# Patient Record
Sex: Male | Born: 1995 | Race: White | Hispanic: No | Marital: Single | Smoking: Never smoker
Health system: Southern US, Community
[De-identification: ages and names within clinical notes are randomized; demographics above are authoritative.]

---
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
## 2020-09-19 ENCOUNTER — Encounter: Payer: Self-pay | Admitting: Family Medicine

## 2020-09-19 ENCOUNTER — Other Ambulatory Visit: Payer: Self-pay

## 2020-09-19 ENCOUNTER — Ambulatory Visit (INDEPENDENT_AMBULATORY_CARE_PROVIDER_SITE_OTHER): Payer: 59 | Admitting: Family Medicine

## 2020-09-19 VITALS — BP 100/60 | HR 80 | Ht 69.0 in | Wt 153.0 lb

## 2020-09-19 DIAGNOSIS — H1031 Unspecified acute conjunctivitis, right eye: Secondary | ICD-10-CM | POA: Diagnosis not present

## 2020-09-19 DIAGNOSIS — T1591XA Foreign body on external eye, part unspecified, right eye, initial encounter: Secondary | ICD-10-CM

## 2020-09-19 NOTE — Progress Notes (Signed)
Date:  09/19/2020   Name:  Cody Adams   DOB:  1995-09-14   MRN:  [id number]   Chief Complaint: Eye Pain (R) eye- red/ blood shot, sore when blinking. Was crusty this morning)  Eye Pain  The right eye is affected. This is a new problem. The current episode started yesterday. The problem has been gradually worsening. There was no injury mechanism. The pain is at a severity of 3/10. The pain is mild ("itchy,scratchy"). There is No known exposure to pink eye. He Does not wear contacts. Associated symptoms include an eye discharge, eye redness and itching. Pertinent negatives include no blurred vision, fever, foreign body sensation, photophobia or vomiting. He has tried eye drops for the symptoms. The treatment provided mild relief.   No results found for: CREATININE, BUN, NA, K, CL, CO2 No results found for: CHOL, HDL, LDLCALC, LDLDIRECT, TRIG, CHOLHDL No results found for: TSH No results found for: HGBA1C No results found for: WBC, HGB, HCT, MCV, PLT No results found for: ALT, AST, GGT, ALKPHOS, BILITOT   Review of Systems  Constitutional:  Negative for fever.  Eyes:  Positive for pain, discharge, redness and itching. Negative for blurred vision and photophobia.  Respiratory:  Negative for shortness of breath.   Cardiovascular:  Negative for chest pain and palpitations.  Gastrointestinal:  Negative for abdominal pain, blood in stool and vomiting.  Genitourinary:  Negative for difficulty urinating.   There are no problems to display for this patient.   Not on File    Social History   Tobacco Use   Smokeless tobacco: Never  Vaping Use   Vaping Use: Every day   Substances: Nicotine  Substance Use Topics   Alcohol use: Yes    Comment: occassionally   Drug use: Never     Medication list has been reviewed and updated.  No outpatient medications have been marked as taking for the 09/19/20 encounter (Office Visit) with Duanne Limerick, MD.    [facility] 2/9 Scores 09/19/2020   PHQ - 2 Score 0  PHQ- 9 Score 0    GAD 7 : Generalized Anxiety Score 09/19/2020  Nervous, Anxious, on Edge 0  Control/stop worrying 0  Worry too much - different things 0  Trouble relaxing 0  Restless 0  Easily annoyed or irritable 0  Afraid - awful might happen 0  Total GAD 7 Score 0    BP Readings from Last 3 Encounters:  09/19/20 100/60    Physical Exam Vitals and nursing note reviewed.  HENT:     Head: Normocephalic.     Right Ear: Tympanic membrane and external ear normal. There is no impacted cerumen.     Left Ear: Tympanic membrane and external ear normal. There is no impacted cerumen.     Nose: Nose normal. No congestion or rhinorrhea.  Eyes:     General: Lids are normal. Vision grossly intact. Gaze aligned appropriately. No scleral icterus.       Right eye: Foreign body present. No discharge.        Left eye: No discharge.     Conjunctiva/sclera:     Right eye: Right conjunctiva is injected.     Left eye: Left conjunctiva is injected.     Pupils: Pupils are equal, round, and reactive to light.      Comments: ?foreign body in right 7 o'clock/ ?foreign 4 in left/noted shadow on iris in right  Neck:     Thyroid: No thyromegaly.  Vascular: No JVD.     Trachea: No tracheal deviation.  Cardiovascular:     Rate and Rhythm: Normal rate and regular rhythm.     Heart sounds: Normal heart sounds. No murmur heard.   No friction rub. No gallop.  Pulmonary:     Effort: No respiratory distress.     Breath sounds: Normal breath sounds. No wheezing or rales.  Abdominal:     General: Bowel sounds are normal.     Palpations: Abdomen is soft. There is no mass.     Tenderness: There is no abdominal tenderness. There is no guarding or rebound.  Musculoskeletal:        General: No tenderness. Normal range of motion.     Cervical back: Normal range of motion and neck supple.  Lymphadenopathy:     Cervical: No cervical adenopathy.  Skin:    General: Skin is warm.      Findings: No rash.  Neurological:     Mental Status: He is alert and oriented to person, place, and time.     Cranial Nerves: No cranial nerve deficit.     Deep Tendon Reflexes: Reflexes are normal and symmetric.    Wt Readings from Last 3 Encounters:  09/19/20 153 lb (69.4 kg)    BP 100/60   Pulse 80   Ht 5\' 9"  (1.753 m)   Wt 153 lb (69.4 kg)   BMI 22.59 kg/m   Assessment and Plan:  1. Foreign body of right eye, initial encounter New onset.  Persistent.  Gradually worsening.  Patient was having discomfort in his bilateral eye which was noted to have some crusting this morning upon which he awoke.  Conjunctiva bilateral has some erythema but upon examination with magnification there was visible brown spot on the iris at about 7:00.  Patient does work in a and I have concerns that this is a foreign body and have referred to ophthalmology for evaluation.  2. Acute conjunctivitis of right eye, unspecified acute conjunctivitis type With the crusting there is a conjunctival irritation but when looking at the left eye as well there is also a corresponding brown punctate area at around 4:00 of the iris.  I am not certain if this is bilateral conjunctival reaction to foreign bodies versus concerns of bacterial/viral conjunctivitis.

## 2020-10-15 ENCOUNTER — Other Ambulatory Visit: Payer: Self-pay

## 2020-10-15 ENCOUNTER — Ambulatory Visit (INDEPENDENT_AMBULATORY_CARE_PROVIDER_SITE_OTHER): Payer: 59 | Admitting: Family Medicine

## 2020-10-15 ENCOUNTER — Encounter: Payer: Self-pay | Admitting: Family Medicine

## 2020-10-15 VITALS — BP 120/68 | HR 64 | Ht 69.0 in | Wt 154.0 lb

## 2020-10-15 DIAGNOSIS — Z0000 Encounter for general adult medical examination without abnormal findings: Secondary | ICD-10-CM

## 2020-10-15 DIAGNOSIS — Z23 Encounter for immunization: Secondary | ICD-10-CM | POA: Diagnosis not present

## 2020-10-15 NOTE — Progress Notes (Signed)
Date:  10/15/2020   Name:  GIOVANIE LEFEBRE   DOB:  Aug 13, 1995   MRN:  [id number]   Chief Complaint: Annual Exam and tdap vacc  CAYSON KALB is a 25 y.o. male who presents today for his Complete Annual Exam. He feels well. He reports exercising not really. He reports he is sleeping well.     No results found for: CREATININE, BUN, NA, K, CL, CO2 No results found for: CHOL, HDL, LDLCALC, LDLDIRECT, TRIG, CHOLHDL No results found for: TSH No results found for: HGBA1C No results found for: WBC, HGB, HCT, MCV, PLT No results found for: ALT, AST, GGT, ALKPHOS, BILITOT   Review of Systems  Constitutional:  Negative for chills and fever.  HENT:  Negative for drooling, ear discharge, ear pain and sore throat.   Respiratory:  Negative for cough, shortness of breath and wheezing.   Cardiovascular:  Negative for chest pain, palpitations and leg swelling.  Gastrointestinal:  Negative for abdominal pain, blood in stool, constipation, diarrhea and nausea.  Endocrine: Negative for polydipsia.  Genitourinary:  Negative for dysuria, frequency, hematuria and urgency.  Musculoskeletal:  Negative for back pain, myalgias and neck pain.  Skin:  Negative for rash.  Allergic/Immunologic: Negative for environmental allergies.  Neurological:  Negative for dizziness and headaches.  Hematological:  Does not bruise/bleed easily.  Psychiatric/Behavioral:  Negative for suicidal ideas. The patient is not nervous/anxious.    There are no problems to display for this patient.   Allergies  Allergen Reactions   Augmentin [Amoxicillin-Pot Clavulanate] Hives    No past surgical history on file.  Social History   Tobacco Use   Smokeless tobacco: Never  Vaping Use   Vaping Use: Every day   Substances: Nicotine  Substance Use Topics   Alcohol use: Yes    Comment: occassionally   Drug use: Never     Medication list has been reviewed and updated.  No outpatient medications have been marked as taking  for the 10/15/20 encounter (Office Visit) with Duanne Limerick, MD.    [facility] 2/9 Scores 10/15/2020 09/19/2020  PHQ - 2 Score 0 0  PHQ- 9 Score 0 0    GAD 7 : Generalized Anxiety Score 10/15/2020 09/19/2020  Nervous, Anxious, on Edge 0 0  Control/stop worrying 0 0  Worry too much - different things 0 0  Trouble relaxing 0 0  Restless 0 0  Easily annoyed or irritable 0 0  Afraid - awful might happen 0 0  Total GAD 7 Score 0 0    BP Readings from Last 3 Encounters:  10/15/20 120/68  09/19/20 100/60    Physical Exam Vitals and nursing note reviewed.  Constitutional:      Appearance: Normal appearance. He is well-groomed and normal weight.  HENT:     Head: Normocephalic.     Jaw: There is normal jaw occlusion.     Right Ear: Hearing, tympanic membrane, ear canal and external ear normal.     Left Ear: Hearing, tympanic membrane, ear canal and external ear normal.     Nose: Nose normal.     Right Turbinates: Not enlarged or swollen.     Left Turbinates: Not enlarged or swollen.     Mouth/Throat:     Lips: Pink.     Mouth: Mucous membranes are moist.     Dentition: Normal dentition.     Tongue: No lesions.     Palate: No mass.     Pharynx: Oropharynx is  clear. Uvula midline.  Eyes:     General: Lids are normal. Vision grossly intact. Gaze aligned appropriately. No scleral icterus.       Right eye: No discharge.        Left eye: No discharge.     Conjunctiva/sclera: Conjunctivae normal.     Pupils: Pupils are equal, round, and reactive to light.     Funduscopic exam:    Right eye: Red reflex present.        Left eye: Red reflex present. Neck:     Thyroid: No thyroid mass, thyromegaly or thyroid tenderness.     Vascular: Normal carotid pulses. No carotid bruit, hepatojugular reflux or JVD.     Trachea: Trachea and phonation normal. No tracheal deviation.  Cardiovascular:     Rate and Rhythm: Normal rate and regular rhythm.     Chest Wall: PMI is not displaced. No thrill.      Pulses: Normal pulses. No decreased pulses.          Carotid pulses are 2+ on the right side and 2+ on the left side.      Radial pulses are 2+ on the right side and 2+ on the left side.       Femoral pulses are 2+ on the right side and 2+ on the left side.      Popliteal pulses are 2+ on the right side and 2+ on the left side.       Dorsalis pedis pulses are 2+ on the right side and 2+ on the left side.       Posterior tibial pulses are 2+ on the right side and 2+ on the left side.     Heart sounds: Normal heart sounds, S1 normal and S2 normal. No murmur heard. No systolic murmur is present.  No diastolic murmur is present.    No friction rub. No gallop. No S3 or S4 sounds.  Pulmonary:     Effort: No respiratory distress.     Breath sounds: Normal breath sounds. No decreased breath sounds, wheezing, rhonchi or rales.  Chest:  Breasts:    Breasts are symmetrical.     Right: Normal. No mass.     Left: Normal. No mass.  Abdominal:     General: Bowel sounds are normal.     Palpations: Abdomen is soft. There is no hepatomegaly, splenomegaly or mass.     Tenderness: There is no abdominal tenderness. There is no guarding or rebound.     Hernia: No hernia is present. There is no hernia in the umbilical area, ventral area, left inguinal area or right inguinal area.  Genitourinary:    Penis: Normal and circumcised.      Testes: Normal.        Right: Mass not present.        Left: Mass not present.     Epididymis:     Right: Normal.     Left: Normal.  Musculoskeletal:        General: No tenderness. Normal range of motion.     Cervical back: Normal, full passive range of motion without pain, normal range of motion and neck supple.     Thoracic back: Normal.     Lumbar back: Normal.     Right lower leg: No edema.     Left lower leg: No edema.  Lymphadenopathy:     Head:     Right side of head: No submental or submandibular adenopathy.     Left  side of head: No submental or submandibular  adenopathy.     Cervical: No cervical adenopathy.     Right cervical: No superficial, deep or posterior cervical adenopathy.    Left cervical: No superficial, deep or posterior cervical adenopathy.     Upper Body:     Right upper body: No supraclavicular or axillary adenopathy.     Left upper body: No supraclavicular or axillary adenopathy.     Lower Body: No right inguinal adenopathy. No left inguinal adenopathy.  Skin:    General: Skin is warm.     Capillary Refill: Capillary refill takes less than 2 seconds.     Findings: No rash.  Neurological:     Mental Status: He is alert and oriented to person, place, and time.     Cranial Nerves: Cranial nerves are intact. No cranial nerve deficit.     Sensory: Sensation is intact.     Motor: Motor function is intact.     Deep Tendon Reflexes: Reflexes are normal and symmetric.  Psychiatric:        Behavior: Behavior is cooperative.    Wt Readings from Last 3 Encounters:  10/15/20 154 lb (69.9 kg)  09/19/20 153 lb (69.4 kg)    BP 120/68   Pulse 64   Ht 5\' 9"  (1.753 m)   Wt 154 lb (69.9 kg)   BMI 22.74 kg/m   Assessment and Plan: RODEL GLASPY is a 25 y.o. male who presents today for his Complete Annual Exam. He feels well. He reports exercising regularly. He reports he is sleeping well.  Patient's chart was reviewed for previous encounters most recent labs most recent imaging in Care Everywhere. 1. Annual physical exam Immunizations are reviewed and recommendations provided.   Age appropriate screening tests are discussed. Counseling given for risk factor reduction interventions.  No subjective/objective concerns noted during history and physical exam as well as review of systems.  We will obtain a renal function panel with lipid panel for baseline annual labs - Renal Function Panel - Lipid Panel With LDL/HDL Ratio - Tdap vaccine greater than or equal to 7yo IM  2. Need for Tdap vaccination Discussed and administered - Tdap  vaccine greater than or equal to 7yo IM

## 2020-10-16 LAB — LIPID PANEL WITH LDL/HDL RATIO
Cholesterol, Total: 160 mg/dL (ref 100–199)
HDL: 57 mg/dL (ref >39)
LDL Chol Calc (NIH): 90 mg/dL (ref 0–99)
LDL/HDL Ratio: 1.6 ratio (ref 0.0–3.6)
Triglycerides: 65 mg/dL (ref 0–149)
VLDL Cholesterol Cal: 13 mg/dL (ref 5–40)

## 2020-10-16 LAB — RENAL FUNCTION PANEL
Albumin: 4.9 g/dL (ref 4.1–5.2)
BUN/Creatinine Ratio: 12 (ref 9–20)
BUN: 12 mg/dL (ref 6–20)
CO2: 26 mmol/L (ref 20–29)
Calcium: 9.5 mg/dL (ref 8.7–10.2)
Chloride: 101 mmol/L (ref 96–106)
Creatinine, Ser: 1.03 mg/dL (ref 0.76–1.27)
Glucose: 91 mg/dL (ref 65–99)
Phosphorus: 3.6 mg/dL (ref 2.8–4.1)
Potassium: 4.6 mmol/L (ref 3.5–5.2)
Sodium: 140 mmol/L (ref 134–144)
eGFR: 104 mL/min/{1.73_m2} (ref >59)

## 2022-07-21 ENCOUNTER — Ambulatory Visit (INDEPENDENT_AMBULATORY_CARE_PROVIDER_SITE_OTHER): Payer: 59 | Admitting: Family Medicine

## 2022-07-21 ENCOUNTER — Ambulatory Visit
Admission: RE | Admit: 2022-07-21 | Discharge: 2022-07-21 | Disposition: A | Discharge status: Home / Self Care | Payer: 59 | Source: Ambulatory Visit | Attending: Family Medicine | Admitting: Family Medicine

## 2022-07-21 ENCOUNTER — Encounter: Payer: Self-pay | Admitting: Family Medicine

## 2022-07-21 ENCOUNTER — Ambulatory Visit
Admission: RE | Admit: 2022-07-21 | Discharge: 2022-07-21 | Disposition: A | Discharge status: Home / Self Care | Payer: 59 | Attending: Family Medicine | Admitting: Family Medicine

## 2022-07-21 VITALS — BP 118/76 | HR 72 | Ht 69.0 in | Wt 141.0 lb

## 2022-07-21 DIAGNOSIS — M5416 Radiculopathy, lumbar region: Secondary | ICD-10-CM

## 2022-07-21 LAB — POCT URINALYSIS DIPSTICK
Bilirubin, UA: NEGATIVE
Blood, UA: NEGATIVE
Glucose, UA: NEGATIVE
Ketones, UA: NEGATIVE
Leukocytes, UA: NEGATIVE
Nitrite, UA: NEGATIVE
Protein, UA: NEGATIVE
Spec Grav, UA: 1.01
Urobilinogen, UA: 0.2 U/dL
pH, UA: 6

## 2022-07-21 MED ORDER — CYCLOBENZAPRINE HCL 10 MG PO TABS: 10.0000 mg | ORAL_TABLET | Freq: Three times a day (TID) | ORAL | 0 refills | Status: AC | PRN

## 2022-07-21 MED ORDER — MELOXICAM 15 MG PO TABS: 15.0000 mg | ORAL_TABLET | Freq: Every day | ORAL | 0 refills | Status: DC

## 2022-07-21 MED ORDER — PREDNISONE 10 MG PO TABS: 10.0000 mg | ORAL_TABLET | Freq: Every day | ORAL | 0 refills | Status: AC

## 2022-07-21 NOTE — Progress Notes (Signed)
Date:  07/21/2022   Name:  Cody Adams   DOB:  31-Oct-1995   MRN:  [id number]   Chief Complaint: Back Pain (Lower left side of back hurting- "may have picked something up wrong")  Back Pain This is a new problem. The current episode started 1 to 4 weeks ago (10 days). The problem has been waxing and waning since onset. The pain is present in the lumbar spine. Radiates to: left hip. The pain is at a severity of 8/10 (resting 4-6). The symptoms are aggravated by sitting, twisting and bending. Pertinent negatives include no abdominal pain, bladder incontinence, bowel incontinence, chest pain, fever, leg pain, numbness, paresis, paresthesias, tingling, weakness or weight loss. Risk factors: family history/no fall bur lift parent. Treatments tried: tylenol. The treatment provided no relief.    Lab Results  Component Value Date   NA 140 10/15/2020   K 4.6 10/15/2020   CO2 26 10/15/2020   GLUCOSE 91 10/15/2020   BUN 12 10/15/2020   CREATININE 1.03 10/15/2020   CALCIUM 9.5 10/15/2020   EGFR 104 10/15/2020   Lab Results  Component Value Date   CHOL 160 10/15/2020   HDL 57 10/15/2020   LDLCALC 90 10/15/2020   TRIG 65 10/15/2020   No results found for: "TSH" No results found for: "HGBA1C" No results found for: "WBC", "HGB", "HCT", "MCV", "PLT" No results found for: "ALT", "AST", "GGT", "ALKPHOS", "BILITOT" No results found for: "25OHVITD2", "25OHVITD3", "VD25OH"   Review of Systems  Constitutional:  Negative for fever and weight loss.  HENT:  Negative for sinus pressure.   Eyes:  Negative for visual disturbance.  Respiratory:  Negative for chest tightness, shortness of breath and wheezing.   Cardiovascular:  Negative for chest pain, palpitations and leg swelling.  Gastrointestinal:  Negative for abdominal pain, blood in stool, bowel incontinence and diarrhea.  Endocrine: Negative for polydipsia and polyuria.  Genitourinary:  Negative for bladder incontinence, difficulty  urinating, flank pain, frequency and hematuria.  Musculoskeletal:  Positive for back pain. Negative for arthralgias.  Neurological:  Negative for tingling, weakness, numbness and paresthesias.    There are no problems to display for this patient.   Allergies  Allergen Reactions   Augmentin [Amoxicillin-Pot Clavulanate] Hives    No past surgical history on file.  Social History   Tobacco Use   Smoking status: Never   Smokeless tobacco: Never  Vaping Use   Vaping Use: Every day   Substances: Nicotine  Substance Use Topics   Alcohol use: Yes    Comment: occassionally   Drug use: Never     Medication list has been reviewed and updated.  No outpatient medications have been marked as taking for the 07/21/22 encounter (Office Visit) with Duanne Limerick, MD.       07/21/2022    8:24 AM 10/15/2020   10:11 AM 09/19/2020    9:00 AM  GAD 7 : Generalized Anxiety Score  Nervous, Anxious, on Edge 0 0 0  Control/stop worrying 0 0 0  Worry too much - different things 0 0 0  Trouble relaxing 0 0 0  Restless 0 0 0  Easily annoyed or irritable 0 0 0  Afraid - awful might happen 0 0 0  Total GAD 7 Score 0 0 0  Anxiety Difficulty Not difficult at all         07/21/2022    8:24 AM 10/15/2020   10:11 AM 09/19/2020    9:00 AM  Depression screen PHQ  2/9  Decreased Interest 0 0 0  Down, Depressed, Hopeless 0 0 0  PHQ - 2 Score 0 0 0  Altered sleeping 0 0 0  Tired, decreased energy 0 0 0  Change in appetite 0 0 0  Feeling bad or failure about yourself  0 0 0  Trouble concentrating 0 0 0  Moving slowly or fidgety/restless 0 0 0  Suicidal thoughts 0 0 0  PHQ-9 Score 0 0 0  Difficult doing work/chores Not difficult at all      BP Readings from Last 3 Encounters:  07/21/22 118/76  10/15/20 120/68  09/19/20 100/60    Physical Exam Vitals and nursing note reviewed.  HENT:     Head: Normocephalic.     Right Ear: Tympanic membrane and external ear normal.     Left Ear: Tympanic  membrane and external ear normal.     Nose: Nose normal.  Eyes:     General: No scleral icterus.       Right eye: No discharge.        Left eye: No discharge.     Conjunctiva/sclera: Conjunctivae normal.     Pupils: Pupils are equal, round, and reactive to light.  Neck:     Thyroid: No thyromegaly.     Vascular: No JVD.     Trachea: No tracheal deviation.  Cardiovascular:     Rate and Rhythm: Normal rate and regular rhythm.     Heart sounds: Normal heart sounds. No murmur heard.    No friction rub. No gallop.  Pulmonary:     Effort: No respiratory distress.     Breath sounds: Normal breath sounds. No wheezing, rhonchi or rales.  Abdominal:     General: Bowel sounds are normal.     Palpations: Abdomen is soft. There is no mass.     Tenderness: There is no abdominal tenderness. There is no guarding or rebound.  Musculoskeletal:        General: No tenderness.     Cervical back: Normal range of motion and neck supple.     Lumbar back: Spasms present. No tenderness or bony tenderness. Normal range of motion. Positive left straight leg raise test. Negative right straight leg raise test.     Comments: Left paraspinal spasm  Lymphadenopathy:     Cervical: No cervical adenopathy.  Skin:    General: Skin is warm.     Findings: No rash.  Neurological:     Mental Status: He is alert and oriented to person, place, and time.     Cranial Nerves: No cranial nerve deficit.     Deep Tendon Reflexes: Reflexes are normal and symmetric.     Wt Readings from Last 3 Encounters:  07/21/22 141 lb (64 kg)  10/15/20 154 lb (69.9 kg)  09/19/20 153 lb (69.4 kg)    BP 118/76   Pulse 72   Ht 5\' 9"  (1.753 m)   Wt 141 lb (64 kg)   SpO2 97%   BMI 20.82 kg/m   Assessment and Plan: 1. Lumbar back pain with radiculopathy affecting left lower extremity New onset.  Exam with paraspinal spasm and limited range of motion due to pain and straight leg raise on the left.  This is consistent with  radiculopathy and likely a disc.  Since there was a near fall we will check x-ray to rule out compression fracture in the meantime we will treat with meloxicam 15 mg once a day with additional Tylenol as needed.  Prednisone taper 40 mg over 12 days and cyclobenzaprine at night.-Limit his bending twisting lifting for 48 hours.  Will recheck on as-needed basis - DG Lumbar Spine Complete - POCT urinalysis dipstick - meloxicam (MOBIC) 15 MG tablet; Take 1 tablet (15 mg total) by mouth daily.  Dispense: 30 tablet; Refill: 0 - predniSONE (DELTASONE) 10 MG tablet; Take 1 tablet (10 mg total) by mouth daily with breakfast. Taper 4,4,4,3,3,3,2,2,2,1,1,1  Dispense: 30 tablet; Refill: 0 - cyclobenzaprine (FLEXERIL) 10 MG tablet; Take 1 tablet (10 mg total) by mouth 3 (three) times daily as needed for muscle spasms.  Dispense: 30 tablet; Refill: 0     Elizabeth Sauer, MD

## 2022-07-22 NOTE — Progress Notes (Signed)
PC to pt discussed results, voiced no concerns

## 2022-10-05 ENCOUNTER — Other Ambulatory Visit: Payer: Self-pay

## 2022-10-05 DIAGNOSIS — M5416 Radiculopathy, lumbar region: Secondary | ICD-10-CM

## 2022-10-05 NOTE — Progress Notes (Signed)
Ref placed.

## 2022-10-28 ENCOUNTER — Telehealth: Payer: Self-pay

## 2022-10-28 ENCOUNTER — Ambulatory Visit (INDEPENDENT_AMBULATORY_CARE_PROVIDER_SITE_OTHER): Payer: 59 | Admitting: Physical Medicine and Rehabilitation

## 2022-10-28 ENCOUNTER — Encounter: Payer: Self-pay | Admitting: Physical Medicine and Rehabilitation

## 2022-10-28 VITALS — BP 120/86 | HR 69

## 2022-10-28 DIAGNOSIS — M47819 Spondylosis without myelopathy or radiculopathy, site unspecified: Secondary | ICD-10-CM

## 2022-10-28 DIAGNOSIS — M7918 Myalgia, other site: Secondary | ICD-10-CM

## 2022-10-28 DIAGNOSIS — M545 Low back pain, unspecified: Secondary | ICD-10-CM | POA: Diagnosis not present

## 2022-10-28 DIAGNOSIS — G8929 Other chronic pain: Secondary | ICD-10-CM

## 2022-10-28 MED ORDER — MELOXICAM 15 MG PO TABS: 15.0000 mg | ORAL_TABLET | Freq: Every day | ORAL | 0 refills | Status: AC

## 2022-10-28 NOTE — Telephone Encounter (Signed)
Pt called and wanted to know if referral could be changed- [facility] is not accepting his ins. The referral will be forwarded to [facility]- Retta Diones is working on it. Pt will call if he hasn't heard by Thursday afternoon

## 2022-10-28 NOTE — Progress Notes (Signed)
Cody Adams - 27 y.o. male MRN [id number]  Date of birth: 1995-08-05  Office Visit Note: Visit Date: 10/28/2022 PCP: Duanne Limerick, MD Referred by: Duanne Limerick, MD  Subjective: Chief Complaint  Patient presents with   Lower Back - Pain    Over one month, radiates to either left or right side. Today it is right side.  Has done tylenol, no relief. Xray in PACS 07/2022 Pulled muscle at work trying to catch somebody from falling.   HPI: Cody Adams is a 27 y.o. male who comes in today per the request of Dr. Elizabeth Sauer for evaluation of chronic, worsening and severe bilateral lower back pain. Pain ongoing for several months, started in June. States he was helping take care of his father, tried to keep him from falling and strained his back. His pain worsens with activity and movement, specifically bending and lifting, currently rates as 6 out of 10. Some relief of pain with home exercise regimen, rest and use of medications. Tried short course of Prednisone and Meloxicam with some relief of pain. Lumbar x-rays from June exhibits no spondylolisthesis, well preserved disc spacing, no fractures/dislocations. Patient currently working as Publishing copy. Concerned his job is causing worsening pain. Patient denies focal weakness, numbness and tingling. No recent trauma or falls.     Oswestry Disability Index Score 8% 0 to 10 (20%)   minimal disability: The patient can cope with most living activities. Usually no treatment is indicated apart from advice on lifting sitting and exercise.  Review of Systems  Musculoskeletal:  Positive for back pain and myalgias.  Neurological:  Negative for tingling, sensory change, focal weakness and weakness.  All other systems reviewed and are negative.  Otherwise per HPI.  Assessment & Plan: Visit Diagnoses:    ICD-10-CM   1. Chronic bilateral low back pain without sciatica  M54.50 Ambulatory referral to Physical Therapy   G89.29     2.  Spondylosis without myelopathy or radiculopathy  M47.819 Ambulatory referral to Physical Therapy    3. Myofascial pain syndrome  M79.18 Ambulatory referral to Physical Therapy       Plan: Findings:  Chronic, worsening and severe bilateral lower back pain. No radicular symptoms noted. Patient continues to have severe pain despite good conservative therapies such as home exercise regimen, rest and use of medications. Patients clinical presentation and exam are complex, differentials include myofascial pain syndrome vs facet mediated pain. He does have some pain with lumbar extension today. Recent lumbar x-rays are re-assuring, no concerning findings. Next step is to place order for regimen of formal physical therapy, he lives near [place] and prefers to do PT near his house. I also prescribed short course of Meloxicam and encouraged him to try Flexeril at bedtime. He had prescription for Flexeril at home from PCP. Should his pain persist would consider obtaining lumbar MRI imaging. Patient has no questions at this time. I instructed him to call and let us know how he is doing during the next several weeks with PT.  He can continue to work and be active as tolerated. No red flag symptoms noted upon exam today.     Meds & Orders:  Meds ordered this encounter  Medications   meloxicam (MOBIC) 15 MG tablet    Sig: Take 1 tablet (15 mg total) by mouth daily.    Dispense:  30 tablet    Refill:  0    Orders Placed This Encounter  Procedures   Ambulatory  referral to Physical Therapy    Follow-up: Return if symptoms worsen or fail to improve.   Procedures: No procedures performed      Clinical History: No specialty comments available.   He reports that he has never smoked. He has never used smokeless tobacco. No results for input(s): "HGBA1C", "LABURIC" in the last 8760 hours.  Objective:  VS:  HT:    WT:   BMI:     BP:120/86  HR:69bpm  TEMP: ( )  RESP:  Physical Exam Vitals and nursing  note reviewed.  HENT:     Head: Normocephalic and atraumatic.     Right Ear: External ear normal.     Left Ear: External ear normal.     Nose: Nose normal.     Mouth/Throat:     Mouth: Mucous membranes are moist.  Eyes:     Extraocular Movements: Extraocular movements intact.  Cardiovascular:     Rate and Rhythm: Normal rate.     Pulses: Normal pulses.  Pulmonary:     Effort: Pulmonary effort is normal.  Abdominal:     General: Abdomen is flat. There is no distension.  Musculoskeletal:        General: Tenderness present.     Cervical back: Normal range of motion.     Comments: Patient rises from seated position to standing without difficulty. Some pain with lumbar extension.. 5/5 strength noted with bilateral hip flexion, knee flexion/extension, ankle dorsiflexion/plantarflexion and EHL. No clonus noted bilaterally. No pain upon palpation of greater trochanters. No pain with internal/external rotation of bilateral hips. Sensation intact bilaterally. Tenderness noted to bilateral quadratus lumborum region. Negative slump test bilaterally. Ambulates without aid, gait steady.     Skin:    General: Skin is warm and dry.     Capillary Refill: Capillary refill takes less than 2 seconds.  Neurological:     General: No focal deficit present.     Mental Status: He is alert and oriented to person, place, and time.  Psychiatric:        Mood and Affect: Mood normal.        Behavior: Behavior normal.     Ortho Exam  Imaging: No results found.  Past Medical/Family/Surgical/Social History: Medications & Allergies reviewed per EMR, new medications updated. There are no problems to display for this patient.  History reviewed. No pertinent past medical history. Family History  Problem Relation Age of Onset   Hypertension Maternal Grandmother    Diabetes Maternal Grandmother    Stroke Maternal Grandfather    Hypertension Maternal Grandfather    Cancer Maternal Grandfather    History  reviewed. No pertinent surgical history. Social History   Occupational History   Not on file  Tobacco Use   Smoking status: Never   Smokeless tobacco: Never  Vaping Use   Vaping status: Every Day   Substances: Nicotine  Substance and Sexual Activity   Alcohol use: Yes    Comment: occassionally   Drug use: Never   Sexual activity: Yes

## 2022-11-02 DIAGNOSIS — M7918 Myalgia, other site: Secondary | ICD-10-CM

## 2022-11-02 DIAGNOSIS — M47819 Spondylosis without myelopathy or radiculopathy, site unspecified: Secondary | ICD-10-CM

## 2022-11-02 DIAGNOSIS — M545 Low back pain, unspecified: Secondary | ICD-10-CM

## 2022-11-10 NOTE — Telephone Encounter (Signed)
Referral faxed to [facility]
